# Patient Record
Sex: Female | Born: 1960 | Race: Black or African American | Hispanic: No | Marital: Single
Health system: Southern US, Community
[De-identification: ages and names within clinical notes are randomized; demographics above are authoritative.]

---

## 2013-06-08 ENCOUNTER — Emergency Department: Payer: Self-pay | Admitting: Emergency Medicine

## 2013-06-08 LAB — BASIC METABOLIC PANEL
ANION GAP: 3 — AB (ref 7–16)
BUN: 12 mg/dL (ref 7–18)
CALCIUM: 9.3 mg/dL (ref 8.5–10.1)
Chloride: 97 mmol/L — ABNORMAL LOW (ref 98–107)
Co2: 33 mmol/L — ABNORMAL HIGH (ref 21–32)
Creatinine: 0.62 mg/dL (ref 0.60–1.30)
EGFR (African American): >60
EGFR (Non-African Amer.): >60
Glucose: 371 mg/dL — ABNORMAL HIGH (ref 65–99)
Osmolality: 281 (ref 275–301)
POTASSIUM: 4.3 mmol/L (ref 3.5–5.1)
Sodium: 133 mmol/L — ABNORMAL LOW (ref 136–145)

## 2013-08-05 ENCOUNTER — Emergency Department: Payer: Self-pay | Admitting: Emergency Medicine

## 2013-08-05 LAB — COMPREHENSIVE METABOLIC PANEL
ALBUMIN: 3.9 g/dL (ref 3.4–5.0)
ALK PHOS: 128 U/L — AB
AST: 90 U/L — AB (ref 15–37)
Anion Gap: 5 — ABNORMAL LOW (ref 7–16)
BUN: 14 mg/dL (ref 7–18)
Bilirubin,Total: 0.3 mg/dL (ref 0.2–1.0)
CREATININE: 0.7 mg/dL (ref 0.60–1.30)
Calcium, Total: 9.1 mg/dL (ref 8.5–10.1)
Chloride: 102 mmol/L (ref 98–107)
Co2: 31 mmol/L (ref 21–32)
EGFR (African American): >60
EGFR (Non-African Amer.): >60
GLUCOSE: 132 mg/dL — AB (ref 65–99)
Osmolality: 278 (ref 275–301)
Potassium: 3.5 mmol/L (ref 3.5–5.1)
SGPT (ALT): 86 U/L — ABNORMAL HIGH (ref 12–78)
Sodium: 138 mmol/L (ref 136–145)
Total Protein: 8.5 g/dL — ABNORMAL HIGH (ref 6.4–8.2)

## 2013-08-05 LAB — CBC
HCT: 40.5 % (ref 35.0–47.0)
HGB: 13.5 g/dL (ref 12.0–16.0)
MCH: 28.9 pg (ref 26.0–34.0)
MCHC: 33.3 g/dL (ref 32.0–36.0)
MCV: 87 fL (ref 80–100)
Platelet: 275 10*3/uL (ref 150–440)
RBC: 4.66 10*6/uL (ref 3.80–5.20)
RDW: 12.9 % (ref 11.5–14.5)
WBC: 4.1 10*3/uL (ref 3.6–11.0)

## 2013-08-05 LAB — TROPONIN I

## 2013-08-13 ENCOUNTER — Ambulatory Visit: Payer: Self-pay | Admitting: Vascular Surgery

## 2013-08-29 ENCOUNTER — Ambulatory Visit: Payer: Self-pay | Admitting: Vascular Surgery

## 2013-08-29 LAB — BASIC METABOLIC PANEL
Anion Gap: 3 — ABNORMAL LOW (ref 7–16)
BUN: 10 mg/dL (ref 7–18)
CO2: 32 mmol/L (ref 21–32)
Calcium, Total: 8.9 mg/dL (ref 8.5–10.1)
Chloride: 100 mmol/L (ref 98–107)
Creatinine: 0.85 mg/dL (ref 0.60–1.30)
Glucose: 215 mg/dL — ABNORMAL HIGH (ref 65–99)
OSMOLALITY: 276 (ref 275–301)
POTASSIUM: 4.3 mmol/L (ref 3.5–5.1)
Sodium: 135 mmol/L — ABNORMAL LOW (ref 136–145)

## 2013-08-29 LAB — CBC
HCT: 37.8 % (ref 35.0–47.0)
HGB: 12.4 g/dL (ref 12.0–16.0)
MCH: 28.7 pg (ref 26.0–34.0)
MCHC: 32.8 g/dL (ref 32.0–36.0)
MCV: 88 fL (ref 80–100)
Platelet: 249 10*3/uL (ref 150–440)
RBC: 4.32 10*6/uL (ref 3.80–5.20)
RDW: 13 % (ref 11.5–14.5)
WBC: 5.3 10*3/uL (ref 3.6–11.0)

## 2013-08-29 LAB — URINALYSIS, COMPLETE
BLOOD: NEGATIVE
Bacteria: NONE SEEN
Bilirubin,UR: NEGATIVE
Glucose,UR: >=500 mg/dL (ref 0–75)
Ketone: NEGATIVE
Leukocyte Esterase: NEGATIVE
NITRITE: NEGATIVE
Ph: 5 (ref 4.5–8.0)
Protein: NEGATIVE
RBC,UR: 1 /HPF (ref 0–5)
SPECIFIC GRAVITY: 1.024 (ref 1.003–1.030)

## 2013-09-05 ENCOUNTER — Inpatient Hospital Stay: Payer: Self-pay | Admitting: Vascular Surgery

## 2013-09-06 LAB — BASIC METABOLIC PANEL
ANION GAP: 5 — AB (ref 7–16)
BUN: 7 mg/dL (ref 7–18)
CREATININE: 0.68 mg/dL (ref 0.60–1.30)
Calcium, Total: 8.6 mg/dL (ref 8.5–10.1)
Chloride: 105 mmol/L (ref 98–107)
Co2: 30 mmol/L (ref 21–32)
Glucose: 140 mg/dL — ABNORMAL HIGH (ref 65–99)
Osmolality: 280 (ref 275–301)
POTASSIUM: 4.3 mmol/L (ref 3.5–5.1)
Sodium: 140 mmol/L (ref 136–145)

## 2013-09-06 LAB — CBC WITH DIFFERENTIAL/PLATELET
BASOS PCT: 0.2 %
Basophil #: 0 10*3/uL (ref 0.0–0.1)
Eosinophil #: 0 10*3/uL (ref 0.0–0.7)
Eosinophil %: 0.1 %
HCT: 33.6 % — AB (ref 35.0–47.0)
HGB: 11.5 g/dL — ABNORMAL LOW (ref 12.0–16.0)
LYMPHS ABS: 1.8 10*3/uL (ref 1.0–3.6)
LYMPHS PCT: 19.6 %
MCH: 29.2 pg (ref 26.0–34.0)
MCHC: 34.2 g/dL (ref 32.0–36.0)
MCV: 86 fL (ref 80–100)
MONO ABS: 0.7 x10 3/mm (ref 0.2–0.9)
MONOS PCT: 7.9 %
Neutrophil #: 6.5 10*3/uL (ref 1.4–6.5)
Neutrophil %: 72.2 %
Platelet: 226 10*3/uL (ref 150–440)
RBC: 3.93 10*6/uL (ref 3.80–5.20)
RDW: 12.7 % (ref 11.5–14.5)
WBC: 9.1 10*3/uL (ref 3.6–11.0)

## 2013-09-06 LAB — APTT: Activated PTT: 25.5 secs (ref 23.6–35.9)

## 2013-09-06 LAB — PROTIME-INR
INR: 1.1
PROTHROMBIN TIME: 14 s (ref 11.5–14.7)

## 2013-09-06 LAB — PATHOLOGY REPORT

## 2014-05-25 NOTE — Op Note (Signed)
PATIENT NAME:  Kathryn Horne, Kathryn Horne MR#:  [id number] DATE OF BIRTH:  August 26, 1960  DATE OF PROCEDURE:  09/05/2013  PREOPERATIVE DIAGNOSES:   1.  High-grade right carotid artery stenosis.  2.  Peripheral arterial disease with claudication.  3.  Parotid tumor.  4.  Hypertension.  5.  Diabetes.  POSTOPERATIVE DIAGNOSES: 1.  High-grade right carotid artery stenosis.  2.  Peripheral arterial disease with claudication.  3.  Parotid tumor.  4.  Hypertension.  5.  Diabetes.    PROCEDURES:  Right carotid endarterectomy with CorMatrix arterial patch reconstruction.   SURGEON: Annice NeedyJason S. Dew, MD   ANESTHESIA: General.   ESTIMATED BLOOD LOSS: 100 mL   INDICATION FOR PROCEDURE:  This is a 54 year old African American female with multiple medical issues. She has a high-grade right carotid artery stenosis by duplex, previous MRA and my interpretation of a recent CT, despite the readings of a lesser degree of stenosis by a local radiologist. My interpretation was a much higher degree of stenosis than would be consistent with her other studies that she has already had. For this reason, she is offered carotid endarterectomy for stroke risk reduction. She complains of pulsatile tinnitus in the right ear as well. She is desirous to proceed with carotid surgery. Risks and benefits were discussed. Informed consent was obtained.   DESCRIPTION OF PROCEDURE: The patient is brought to the operative suite, and after an adequate level of general anesthesia was obtained, the right neck and chest were sterilely prepped and draped, and a sterile surgical field was created. The incision was created along the anterior border of the sternocleidomastoid then dissected down through the platysma with electrocautery. The sternocleidomastoid was retracted laterally. There was a large facial vein and 2 large crossing veins that were ligated and divided between silk ties. I encountered a significant number of lymph nodes; these were not hard  or particularly enlarged, but given her history of a parotid tumor that has not yet been resected, I sent several lymph nodes off as a specimen.   After dissecting out and ligating the facial vein, this exposed the carotid bifurcation. I locally anesthetize the carotid with 1% lidocaine. Vessel loops were placed around the common carotid artery, external carotid artery, superior thyroid artery, and distal internal carotid artery beyond the lesion, and the patient was systemically heparinized. Control was then pulled up on the vessel loops and an anterior arteriotomy was created with an 11 blade and extended with Potts scissors. The Pruitt-Inahara shunt was placed. Opening the carotid artery identified a very high-grade near occlusive lesion in the 85-90% range with thick lipid-laden plaque. An endarterectomy was then performed in a typical fashion. An eversion endarterectomy was performed of the external carotid artery. Proximal endpoint was cut flush with tenotomy scissors and the distal endpoint was created with gentle traction. The distal endpoint was tacked down with three 7-0 Prolene sutures, and I used a CorMatrix patch to reconstruct the arterial defect. This was cut and beveled, and started at the distal endpoint with a 6-0 Prolene. It was ran approximately half the length of the arteriotomy on each side. The patch was then cut and beveled to an appropriate length to match the arteriotomy proximally, and a second 6-0 Prolene was started. The medial suture line was run and tied together. The lateral suture line was run approximately one quarter length of the arteriotomy, and the shunt was then removed. The vessel was flushed in the internal, external, and common carotid arteries and locally heparinized, and  I completed the anastomosis, flushing several cardiac cycles out the external carotid artery, and flushing and de-airing through the external carotid artery prior to release of control. On release, two  6-0 Prolene patch sutures were used for hemostasis. Hemostasis was achieved. Surgicel and Evicel topical hemostatic agents were placed.   The wound was then closed with several interrupted 3-0 Vicryl sutures in the sternocleidomastoid space. The platysma was closed with a running 3-0 Vicryl and the skin was closed with 4-0 Monocryl. Dermabond was placed as a dressing. The patient was awakened from anesthesia and taken to the recovery room in stable condition, having tolerated the procedure well.    ____________________________ Annice Needy, MD jsd:ms D: 09/05/2013 12:44:14 ET T: 09/05/2013 13:11:52 ET JOB#: [id number]  cc: Annice Needy, MD, <Dictator> Annice Needy MD ELECTRONICALLY SIGNED 09/12/2013 14:24

## 2016-06-05 IMAGING — CT CT ANGIOGRAPHY NECK
2 of 7 series · 9 of 33 positions shown · IV contrast (APPLIED)
Comparison: CT head without contrast 08/05/2013

CLINICAL DATA: Visual disturbances. Dizziness. Right-sided
tinnitus. Carotid stenosis.

EXAM:
CT ANGIOGRAPHY NECK
TECHNIQUE: Multidetector CT imaging of the neck was performed using the
standard protocol during bolus administration of intravenous
contrast. Multiplanar CT image reconstructions and MIPs were
obtained to evaluate the vascular anatomy. Carotid stenosis
measurements (when applicable) are obtained utilizing NASCET
criteria, using the distal internal carotid diameter as the
denominator.
CONTRAST:  80 mL Isovue 370

[Series 4: cta neck · axial · 0.47mm/px · z∈[-220,-106]mm · 3 of 153 slices shown]
[im 39/153  soft-tissue]
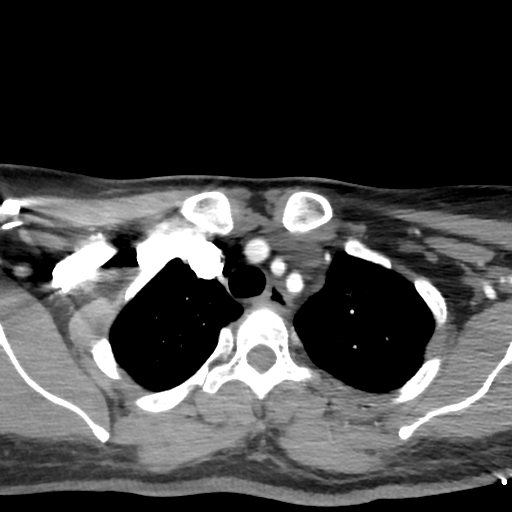
[im 77/153  soft-tissue]
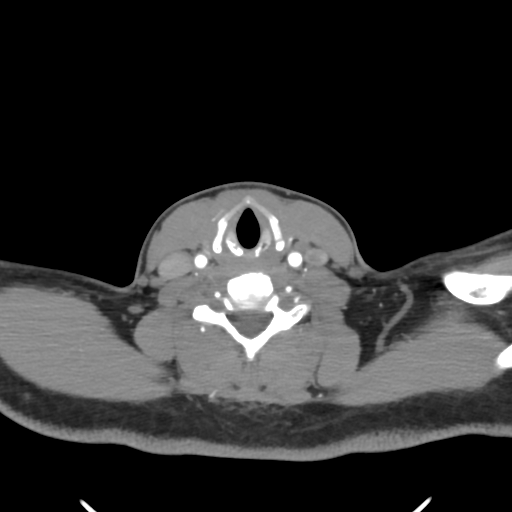
[im 115/153  soft-tissue]
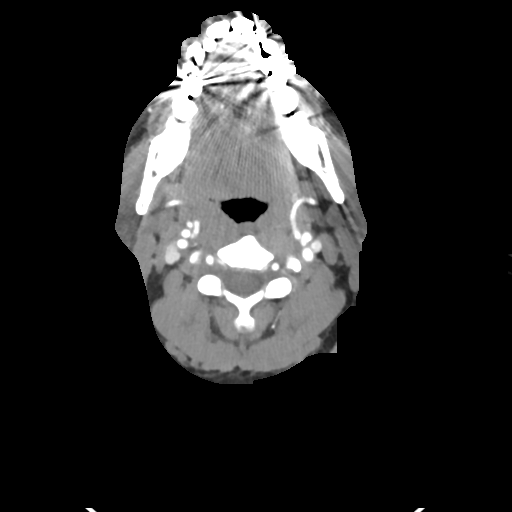

[Series 5: ax thin · axial · 0.44mm/px · z∈[-255,-91]mm · 6 of 230 slices shown]
[im 33/230  soft-tissue]
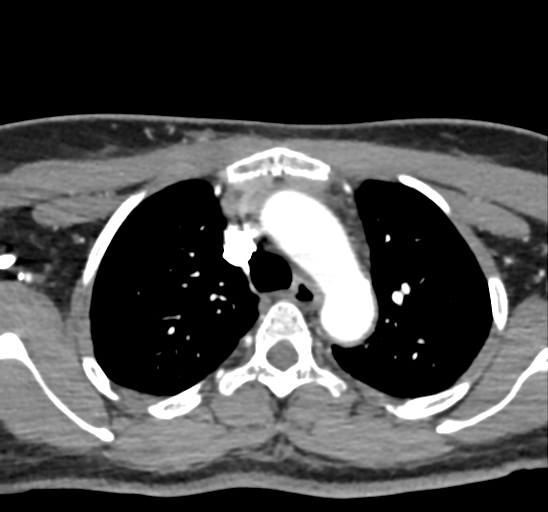
[im 66/230  bone]
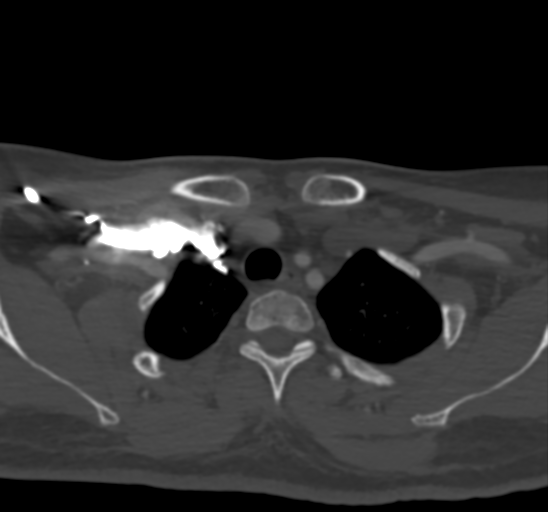
[im 99/230  soft-tissue]
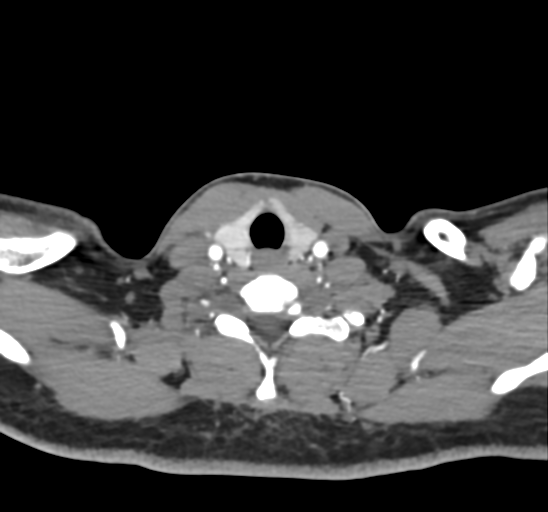
[im 131/230  bone]
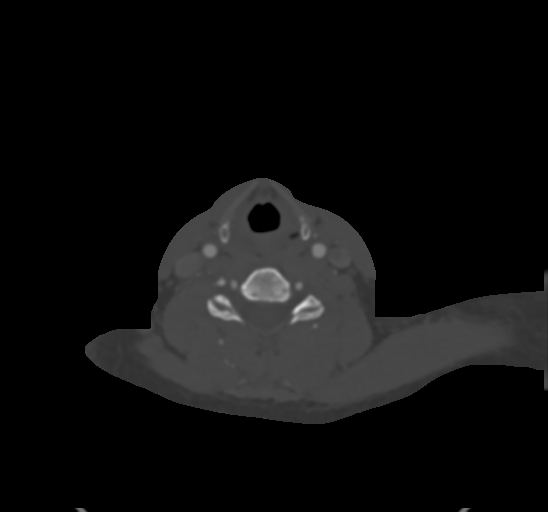
[im 164/230  soft-tissue]
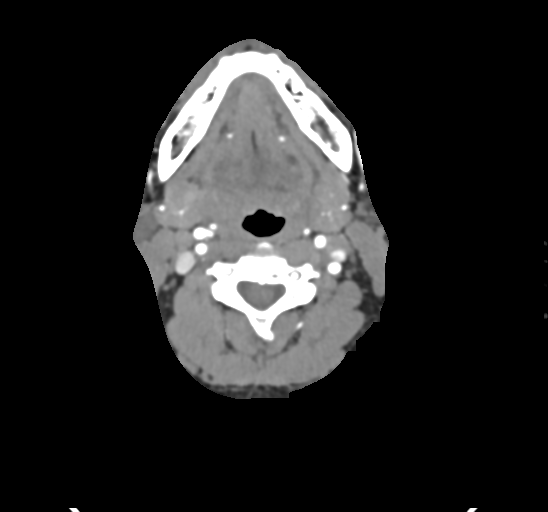
[im 197/230  bone]
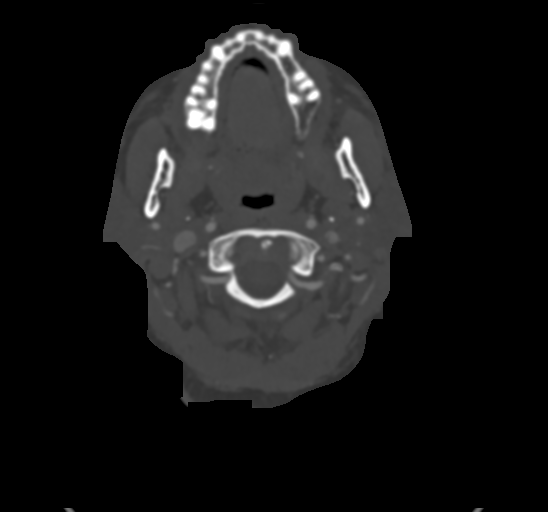

[9 of 33 positions shown; findings below may reference images not displayed]

FINDINGS: A 3 vessel arch configuration is present.

Both vertebral arteries originate from the subclavian arteries.
There is a high-grade stenosis of the proximal dominant left
vertebral artery. The vertebral arteries are of similar caliber
throughout most of the neck without other focal tandem stenoses. The
PICA origins are visualized and normal bilaterally. The
vertebrobasilar junction is intact.

The right common carotid artery is normal. The vessel is narrowed to
1.6 mm just proximal to the bifurcation. This compares with a more
distal right internal carotid artery at 3.3 mm just below the
skullbase.

The left common carotid artery is within normal limits.
Atherosclerotic calcifications are present at the carotid
bifurcation without a significant stenosis relative to the more
distal vessel. The left internal carotid artery is otherwise within
normal limits to the skullbase.

The soft tissues of the neck are otherwise unremarkable. No focal
mucosal or submucosal lesions are present. The vocal cords are
midline and symmetric. The thyroid is unremarkable. The lung apices
are clear. The bone windows demonstrate no focal lytic or blastic
lesions.

Review of the MIP images confirms the above findings.
IMPRESSION: 1. High-grade stenosis of the proximal left vertebral artery. This
appears to be the dominant vessel. The smaller right vertebral
artery is intact.
2. Approximately 50% stenosis of the distal right common carotid
artery and bifurcation relative to the more distal cervical right
internal carotid artery.
3. Atherosclerotic changes at the left carotid bifurcation without
significant stenosis.
